# Patient Record
Sex: Male | Born: 1940 | Race: White | Hispanic: No | Marital: Married | Smoking: Former smoker
Health system: Southern US, Community
[De-identification: ages and names within clinical notes are randomized; demographics above are authoritative.]

## PROBLEM LIST (undated history)

## (undated) DIAGNOSIS — E785 Hyperlipidemia, unspecified: Secondary | ICD-10-CM

## (undated) DIAGNOSIS — C4490 Unspecified malignant neoplasm of skin, unspecified: Secondary | ICD-10-CM

## (undated) DIAGNOSIS — I10 Essential (primary) hypertension: Secondary | ICD-10-CM

## (undated) DIAGNOSIS — E119 Type 2 diabetes mellitus without complications: Secondary | ICD-10-CM

## (undated) HISTORY — DX: Unspecified malignant neoplasm of skin, unspecified: C44.90

## (undated) HISTORY — DX: Essential (primary) hypertension: I10

## (undated) HISTORY — DX: Type 2 diabetes mellitus without complications: E11.9

## (undated) HISTORY — DX: Hyperlipidemia, unspecified: E78.5

---

## 2000-05-28 HISTORY — PX: INGUINAL HERNIA REPAIR: SUR1180

## 2001-11-26 ENCOUNTER — Ambulatory Visit (HOSPITAL_COMMUNITY): Admission: RE | Admit: 2001-11-26 | Discharge: 2001-11-26 | Payer: Self-pay

## 2002-06-18 ENCOUNTER — Ambulatory Visit (HOSPITAL_BASED_OUTPATIENT_CLINIC_OR_DEPARTMENT_OTHER): Admission: RE | Admit: 2002-06-18 | Discharge: 2002-06-18 | Payer: Self-pay | Admitting: Family Medicine

## 2005-01-15 ENCOUNTER — Ambulatory Visit (HOSPITAL_COMMUNITY): Admission: RE | Admit: 2005-01-15 | Discharge: 2005-01-15 | Payer: Self-pay | Admitting: Family Medicine

## 2013-05-06 ENCOUNTER — Telehealth (INDEPENDENT_AMBULATORY_CARE_PROVIDER_SITE_OTHER): Payer: Self-pay

## 2013-05-06 NOTE — Telephone Encounter (Signed)
LMOM for pt to call me. I left a message at home but couldn't at work b/c it's been disconnected. Dr Dwain Sarna wanted me to ask if the pt wants to be seen earlier than his appt on 12/18. I asked for the pt to ask for me when he returns my call.

## 2013-05-06 NOTE — Telephone Encounter (Signed)
Pt returned my call. The pt declined moving his appt up earlier b/c he has something already planned for this Friday. I advised him that I would just leave it a lone for 05/14/13. The pt agrees.

## 2013-05-14 ENCOUNTER — Ambulatory Visit (INDEPENDENT_AMBULATORY_CARE_PROVIDER_SITE_OTHER): Payer: Medicare Other | Admitting: General Surgery

## 2013-05-14 ENCOUNTER — Encounter (INDEPENDENT_AMBULATORY_CARE_PROVIDER_SITE_OTHER): Payer: Self-pay | Admitting: General Surgery

## 2013-05-14 VITALS — BP 118/80 | HR 62 | Temp 97.1°F | Resp 14 | Ht 72.0 in | Wt 201.6 lb

## 2013-05-14 DIAGNOSIS — C4359 Malignant melanoma of other part of trunk: Secondary | ICD-10-CM

## 2013-05-14 NOTE — Progress Notes (Signed)
Patient ID: STAR CHEESE, male   DOB: 1940-12-31, 72 y.o.   MRN: [id number]  Chief Complaint  Patient presents with  . New Evaluation    melanoma rt chest    HPI David Yoder is a 72 y.o. male.  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 He is otherwise fairly healthy and active. He has stable hypertension and diabetes. He was evaluated by Dr. Doreen Beam recently was found to have a right chest wall nevus. He underwent a shave biopsy that shows a lentigo melanoma. The thickness is 0.62 mm. There is no ulceration identified and there is less than 1 per millimeters squared of the mitotic index. He comes in today to discuss treatment. Past Medical History  Diagnosis Date  . Diabetes mellitus without complication   . Hyperlipidemia   . Hypertension     Past Surgical History  Procedure Laterality Date  . Inguinal hernia repair  2002    Family History  Problem Relation Age of Onset  . Heart disease Father     Social History History  Substance Use Topics  . Smoking status: Former Games developer  . Smokeless tobacco: Not on file  . Alcohol Use: No    No Known Allergies  Current Outpatient Prescriptions  Medication Sig Dispense Refill  . atorvastatin (LIPITOR) 40 MG tablet Take 40 mg by mouth daily.      Marland Kitchen glimepiride (AMARYL) 4 MG tablet Take 4 mg by mouth daily with breakfast.      . lisinopril-hydrochlorothiazide (PRINZIDE,ZESTORETIC) 20-25 MG per tablet Take 1 tablet by mouth daily.       No current facility-administered medications for this visit.    Review of Systems Review of Systems  Constitutional: Negative for fever, chills and unexpected weight change.  HENT: Negative for congestion, hearing loss, sore throat, trouble swallowing and voice change.   Eyes: Negative for visual disturbance.  Respiratory: Negative for cough and wheezing.   Cardiovascular: Negative for chest pain, palpitations and leg  swelling.  Gastrointestinal: Negative for nausea, vomiting, abdominal pain, diarrhea, constipation, blood in stool, abdominal distention, anal bleeding and rectal pain.  Genitourinary: Negative for hematuria and difficulty urinating.  Musculoskeletal: Negative for arthralgias.  Skin: Negative for rash and wound.  Neurological: Negative for seizures, syncope, weakness and headaches.  Hematological: Negative for adenopathy. Does not bruise/bleed easily.  Psychiatric/Behavioral: Negative for confusion.    Blood pressure 118/80, pulse 62, temperature 97.1 F (36.2 C), resp. rate 14, height 6' (1.829 m), weight 201 lb 9.6 oz (91.445 kg).  Physical Exam Physical Exam  Vitals reviewed. Constitutional: He appears well-developed and well-nourished.  Neck: Neck supple.  Cardiovascular: Normal rate, regular rhythm and normal heart sounds.   Pulmonary/Chest: Effort normal and breath sounds normal. No respiratory distress.    Lymphadenopathy:    He has no cervical adenopathy.    He has no axillary adenopathy.       Right: No supraclavicular adenopathy present.       Left: No supraclavicular adenopathy present.    Data Reviewed Path and Dr Leta Speller note  Assessment    0.62 mm thick chest wall melanoma with no ulceration, <73mm mitoses    Plan    wle with 1 cm margin chest melanoma  I discussed with him a wide local excision with 1 cm margin for treatment of this thin melanoma with no ulceration and a low mitotic index. We discussed that I do not  think that we need to do any node evaluation for this. We discussed the surgery as an outpatient. We discussed is recovered I told him he could go back to work 2 days after surgery. We discussed risks associated with this as well. We'll plan on doing this soon.        Rachna Schonberger 05/14/2013, 9:54 AM

## 2013-05-15 ENCOUNTER — Other Ambulatory Visit (INDEPENDENT_AMBULATORY_CARE_PROVIDER_SITE_OTHER): Payer: Self-pay | Admitting: General Surgery

## 2013-05-15 ENCOUNTER — Other Ambulatory Visit (INDEPENDENT_AMBULATORY_CARE_PROVIDER_SITE_OTHER): Payer: Self-pay | Admitting: *Deleted

## 2013-05-15 DIAGNOSIS — C4359 Malignant melanoma of other part of trunk: Secondary | ICD-10-CM

## 2013-05-15 MED ORDER — OXYCODONE-ACETAMINOPHEN 5-325 MG PO TABS: 1.0000 | ORAL_TABLET | ORAL | Status: AC | PRN

## 2013-05-18 ENCOUNTER — Encounter (INDEPENDENT_AMBULATORY_CARE_PROVIDER_SITE_OTHER): Payer: Self-pay

## 2013-05-25 ENCOUNTER — Telehealth (INDEPENDENT_AMBULATORY_CARE_PROVIDER_SITE_OTHER): Payer: Self-pay | Admitting: General Surgery

## 2013-05-25 NOTE — Telephone Encounter (Signed)
Message copied by Ignacia Marvel on Mon May 25, 2013 11:16 AM ------      Message from: Mervin Kung      Created: Wed May 20, 2013 10:42 AM      Contact: [phone]       Elease Hashimoto,      Pt and family are aware Dr Renaldo Fiddler and nurse are not in office today but they would like results from the mass removed.       sonya ------

## 2013-05-25 NOTE — Telephone Encounter (Signed)
Dr. Dwain Sarna called the patient and informed him of his pathology.

## 2013-06-01 ENCOUNTER — Encounter (INDEPENDENT_AMBULATORY_CARE_PROVIDER_SITE_OTHER): Payer: Self-pay | Admitting: General Surgery

## 2013-06-01 ENCOUNTER — Ambulatory Visit (INDEPENDENT_AMBULATORY_CARE_PROVIDER_SITE_OTHER): Payer: Medicare Other | Admitting: General Surgery

## 2013-06-01 VITALS — BP 128/64 | HR 70 | Resp 16 | Ht 72.0 in | Wt 202.0 lb

## 2013-06-01 DIAGNOSIS — Z09 Encounter for follow-up examination after completed treatment for conditions other than malignant neoplasm: Secondary | ICD-10-CM

## 2013-06-01 NOTE — Progress Notes (Signed)
Subjective:     Patient ID: David Yoder, male   DOB: 08/22/1940, 73 y.o.   MRN: [id number]  HPI This is a 73 year old male who had a right chest melanoma. He went underwent wide local excision with 1 cm margins. He's done well after this a returns today without any complaints. Some of his Steri-Strips have begun  to come off. He comes back in to discuss his pathology which we have already discussed by phone.  Review of Systems     Objective:   Physical Exam Healing right chest incision without infection, no seroma    Assessment:     S/p wle melanoma, stage Ia    Plan:     No further treatment needed. He is released to full activity. He will continue to get his scheduled skin exams.

## 2013-07-01 ENCOUNTER — Encounter: Payer: Self-pay | Admitting: Internal Medicine

## 2013-08-03 ENCOUNTER — Ambulatory Visit (AMBULATORY_SURGERY_CENTER): Payer: Self-pay

## 2013-08-03 VITALS — Ht 70.0 in | Wt 206.8 lb

## 2013-08-03 DIAGNOSIS — Z860100 Personal history of colon polyps, unspecified: Secondary | ICD-10-CM

## 2013-08-03 MED ORDER — MOVIPREP 100 G PO SOLR: 1.0000 | Freq: Once | ORAL | Status: DC

## 2013-08-06 ENCOUNTER — Encounter: Payer: Self-pay | Admitting: Internal Medicine

## 2013-08-17 ENCOUNTER — Ambulatory Visit (AMBULATORY_SURGERY_CENTER): Payer: Medicare Other | Admitting: Internal Medicine

## 2013-08-17 ENCOUNTER — Encounter: Payer: Self-pay | Admitting: Internal Medicine

## 2013-08-17 ENCOUNTER — Telehealth: Payer: Self-pay

## 2013-08-17 ENCOUNTER — Other Ambulatory Visit: Payer: Self-pay

## 2013-08-17 VITALS — BP 94/70 | HR 51 | Temp 97.6°F | Resp 16 | Ht 71.0 in | Wt 206.0 lb

## 2013-08-17 DIAGNOSIS — R195 Other fecal abnormalities: Secondary | ICD-10-CM

## 2013-08-17 DIAGNOSIS — Z1211 Encounter for screening for malignant neoplasm of colon: Secondary | ICD-10-CM

## 2013-08-17 DIAGNOSIS — Z86010 Personal history of colonic polyps: Secondary | ICD-10-CM

## 2013-08-17 MED ORDER — SODIUM CHLORIDE 0.9 % IV SOLN: 500.0000 mL | INTRAVENOUS | Status: DC

## 2013-08-17 NOTE — Telephone Encounter (Signed)
Pt scheduled for Virtual Colon at [facility] [address] 08/24/13. Pt to arrive there at 7:45am for an 8am appt. Pt to pick up prep by Thursday from that office. Office number [phone]. Left message for pt to call back.

## 2013-08-17 NOTE — Progress Notes (Signed)
Procedure ends, to recovery, report given and VSS. 

## 2013-08-17 NOTE — Patient Instructions (Signed)
YOU HAD AN ENDOSCOPIC PROCEDURE TODAY AT THE [facility]: Refer to the procedure report that was given to you for any specific questions about what was found during the examination.  If the procedure report does not answer your questions, please call your gastroenterologist to clarify.  If you requested that your care partner not be given the details of your procedure findings, then the procedure report has been included in a sealed envelope for you to review at your convenience later.  YOU SHOULD EXPECT: Some feelings of bloating in the abdomen. Passage of more gas than usual.  Walking can help get rid of the air that was put into your GI tract during the procedure and reduce the bloating. If you had a lower endoscopy (such as a colonoscopy or flexible sigmoidoscopy) you may notice spotting of blood in your stool or on the toilet paper. If you underwent a bowel prep for your procedure, then you may not have a normal bowel movement for a few days.  DIET: Your first meal following the procedure should be a light meal and then it is ok to progress to your normal diet.  A half-sandwich or bowl of soup is an example of a good first meal.  Heavy or fried foods are harder to digest and may make you feel nauseous or bloated.  Likewise meals heavy in dairy and vegetables can cause extra gas to form and this can also increase the bloating.  Drink plenty of fluids but you should avoid alcoholic beverages for 24 hours.  ACTIVITY: Your care partner should take you home directly after the procedure.  You should plan to take it easy, moving slowly for the rest of the day.  You can resume normal activity the day after the procedure however you should NOT DRIVE or use heavy machinery for 24 hours (because of the sedation medicines used during the test).    SYMPTOMS TO REPORT IMMEDIATELY: A gastroenterologist can be reached at any hour.  During normal business hours, 8:30 AM to 5:00 PM Monday through Friday,  call [phone].  After hours and on weekends, please call the GI answering service at [phone] who will take a message and have the physician on call contact you.   Following lower endoscopy (colonoscopy or flexible sigmoidoscopy):  Excessive amounts of blood in the stool  Significant tenderness or worsening of abdominal pains  Swelling of the abdomen that is new, acute  Fever of 100F or higher    FOLLOW UP: If any biopsies were taken you will be contacted by phone or by letter within the next 1-3 weeks.  Call your gastroenterologist if you have not heard about the biopsies in 3 weeks.  Our staff will call the home number listed on your records the next business day following your procedure to check on you and address any questions or concerns that you may have at that time regarding the information given to you following your procedure. This is a courtesy call and so if there is no answer at the home number and we have not heard from you through the emergency physician on call, we will assume that you have returned to your regular daily activities without incident.  SIGNATURES/CONFIDENTIALITY: You and/or your care partner have signed paperwork which will be entered into your electronic medical record.  These signatures attest to the fact that that the information above on your After Visit Summary has been reviewed and is understood.  Full responsibility of the confidentiality   of this discharge information lies with you and/or your care-partner.  Dr. Blanch Media office will be in touch to shedule a virtual colonoscopy.

## 2013-08-17 NOTE — Op Note (Signed)
[facility]  [address] [place], [address]   COLONOSCOPY PROCEDURE REPORT  PATIENT: David Yoder, David Yoder  MR#: [id number] BIRTHDATE: 03-30-1941 , 72  yrs. old GENDER: Male ENDOSCOPIST: Eustace Quail, MD REFERRED GB:[id number] Arelia Sneddon, M.D. PROCEDURE DATE:  08/17/2013 PROCEDURE:   Colonoscopy, diagnostic (incomplete) First Screening Colonoscopy - Avg.  risk and is 50 yrs.  old or older - No.  Prior Negative Screening - Now for repeat screening. N/A  History of Adenoma - Now for follow-up colonoscopy & has been > or = to 3 yrs.  N/A  Polyps Removed Today? No.  Recommend repeat exam, <10 yrs? No. ASA CLASS:   Class II INDICATIONS:heme-positive stool. MEDICATIONS: MAC sedation, administered by CRNA and propofol (Diprivan) 250mg  IV  DESCRIPTION OF PROCEDURE:   After the risks benefits and alternatives of the procedure were thoroughly explained, informed consent was obtained.  A digital rectal exam revealed no abnormalities of the rectum.   The LB [id number] and LB [id number]  endoscope was introduced through the anus and advanced to the sigmoid colon. No adverse events experienced. Limited by a stricture.   The quality of the prep was good, using MoviPrep  The instrument was then slowly withdrawn as the colon was fully examined.      COLON FINDINGS: Severe diverticulosis with tight stenosis of the sigmoid colon, not permitting passage of the pediatric colonoscope proximal. The remaining portions of the distal colon were normal. Retroflexed views revealed internal hemorrhoids. The time to cecum=minutes 0 seconds.  Withdrawal time=minutes 0 seconds.  The scope was withdrawn and the procedure completed. COMPLICATIONS: There were no complications.  ENDOSCOPIC IMPRESSION: 1.Severe diverticulosis with tight sigmoid stenosis 2. Incomplete exam  RECOMMENDATIONS: 1. My office will arrange for a virtual colonoscopy "heme positive stool in the sigmoid  obstruction, evaluate"   eSigned:  Eustace Quail, MD 08/17/2013 11:26 AM   cc: Claris Gower, MD and The Patient

## 2013-08-18 ENCOUNTER — Telehealth: Payer: Self-pay | Admitting: *Deleted

## 2013-08-18 NOTE — Telephone Encounter (Signed)
Pt aware of appt date and time

## 2013-08-18 NOTE — Telephone Encounter (Signed)
  Follow up Call-  Call back number 08/17/2013  Post procedure Call Back phone  # [phone]  Permission to leave phone message Yes     Patient questions:  Do you have a fever, pain , or abdominal swelling? no Pain Score  0 *  Have you tolerated food without any problems? yes  Have you been able to return to your normal activities? yes  Do you have any questions about your discharge instructions: Diet   no Medications  no Follow up visit  no  Do you have questions or concerns about your Care? no  Actions: * If pain score is 4 or above: No action needed, pain <4.

## 2013-08-24 ENCOUNTER — Ambulatory Visit
Admission: RE | Admit: 2013-08-24 | Discharge: 2013-08-24 | Disposition: A | Discharge status: Home / Self Care | Payer: Medicare Other | Source: Ambulatory Visit | Attending: Internal Medicine | Admitting: Internal Medicine

## 2013-08-24 DIAGNOSIS — R195 Other fecal abnormalities: Secondary | ICD-10-CM

## 2014-10-31 IMAGING — CT CT VIRTUAL COLONOSCOPY DIAGNOSTIC
3 of 7 series · 12 of 36 positions shown, 18 images · non-contrast
Comparison: None.

CLINICAL DATA: Incomplete colonoscopy due to sigmoid obstruction,
heme-positive stool

EXAM:
CT VIRTUAL COLONOSCOPY DIAGNOSTIC
TECHNIQUE: The patient was given a standard mag citrate bowel preparation with
Gastrografin and barium for fluid and stool tagging respectively.
The quality of the bowel preparation is moderate. Automated CO2
insufflation of the colon was performed prior to image acquisition
and colonic distention is moderate. Image post processing was used
to generate a 3D endoluminal fly-through projection of the colon and
to electronically subtract stool/fluid as appropriate.

[Series 2: supine (id) · axial · 0.77mm/px · z∈[-428,-23]mm · 7 of 434 slices shown, 12 images]
[im 55/434  soft-tissue]
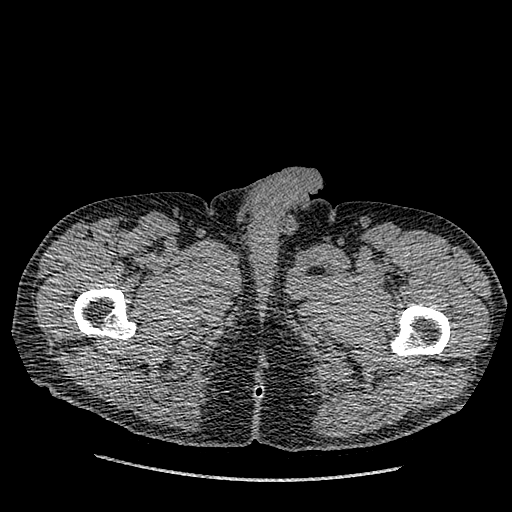
[im 55/434  bone]
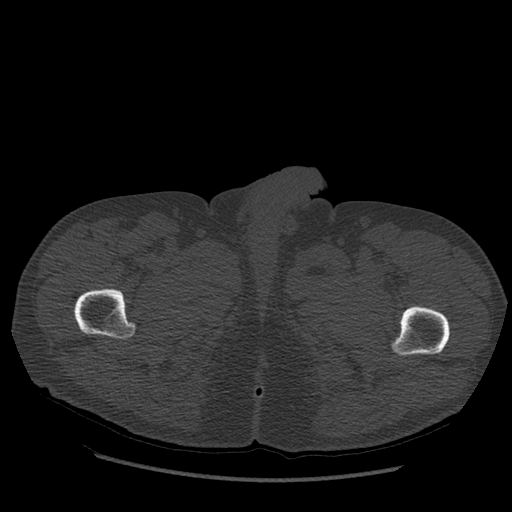
[im 109/434  soft-tissue]
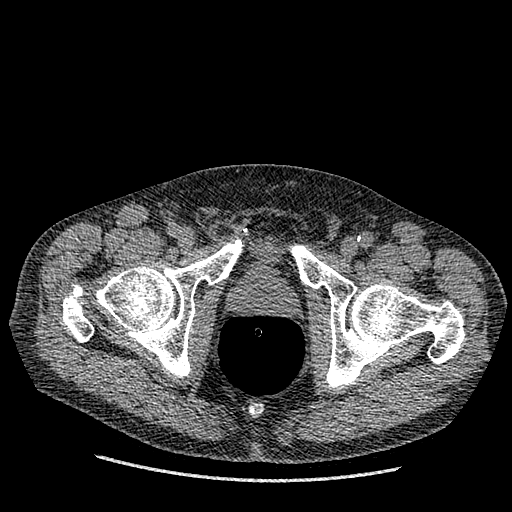
[im 163/434  soft-tissue]
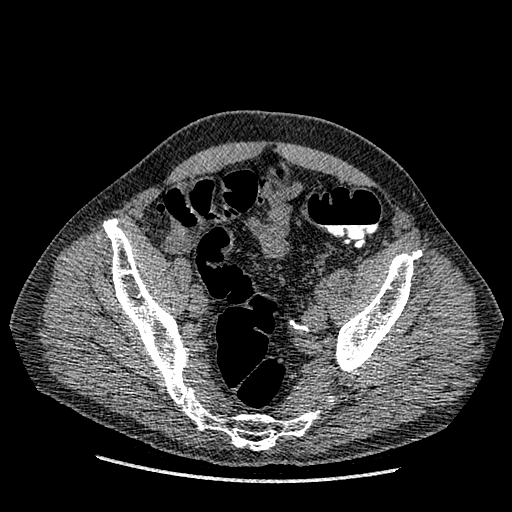
[im 217/434  soft-tissue]
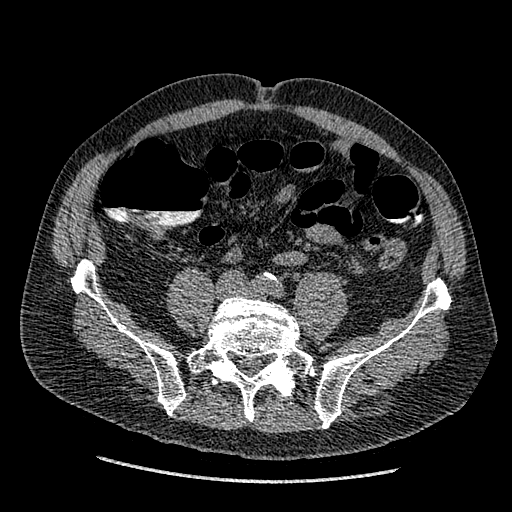
[im 217/434  lung]
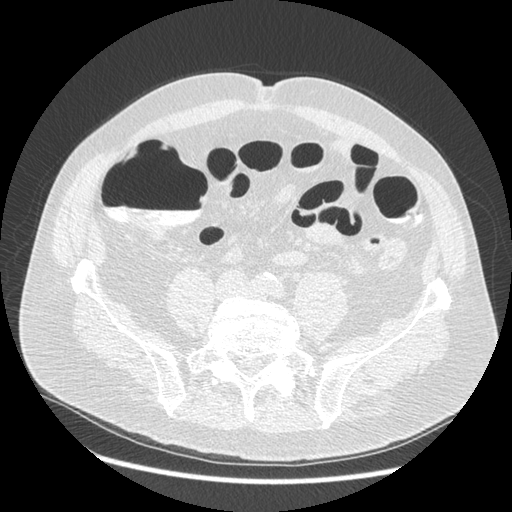
[im 271/434  soft-tissue]
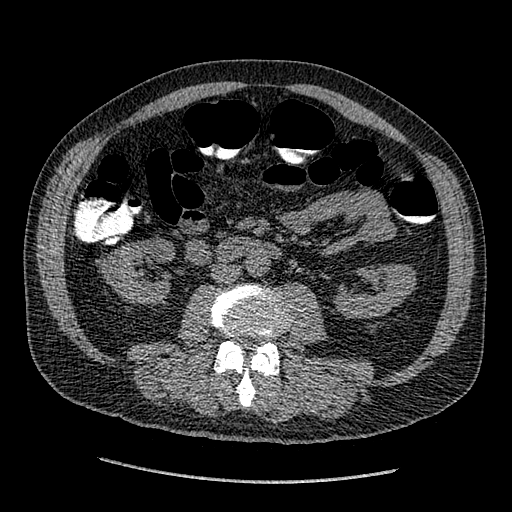
[im 271/434  lung]
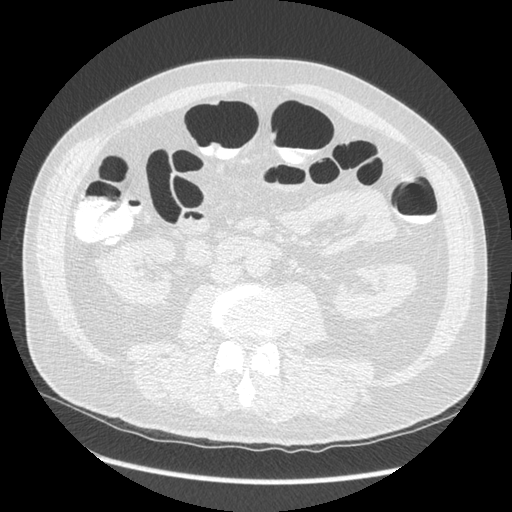
[im 325/434  soft-tissue]
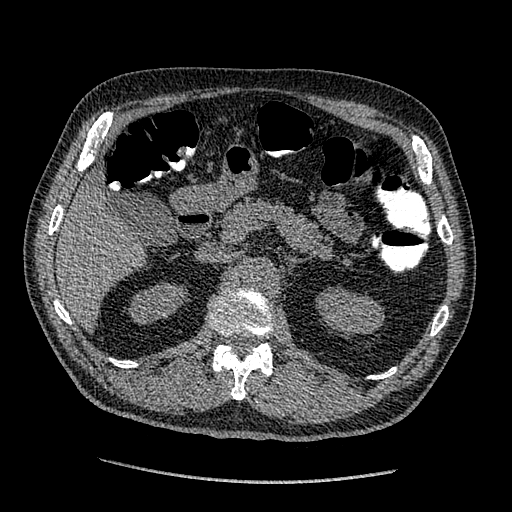
[im 325/434  lung]
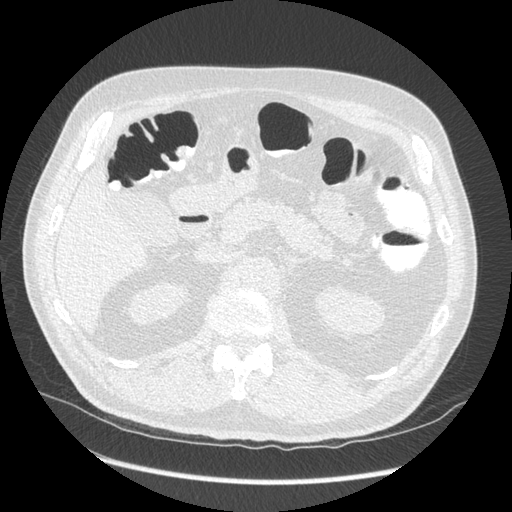
[im 379/434  soft-tissue]
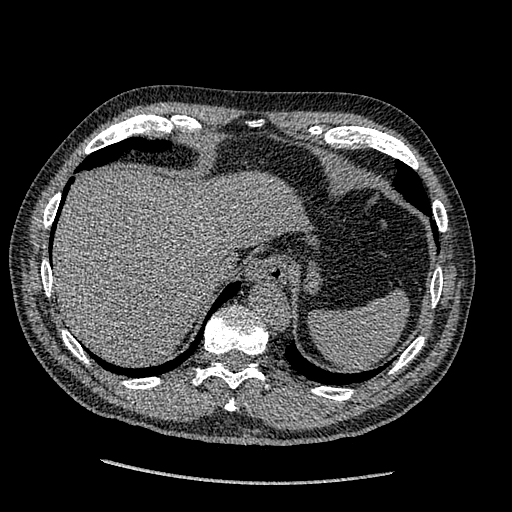
[im 379/434  lung]
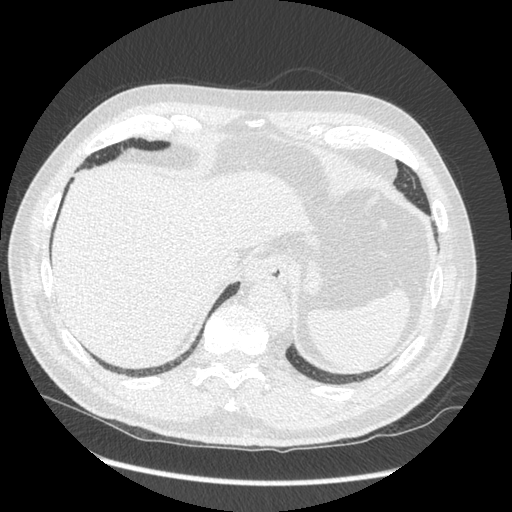

[Series 6: prone (id) · axial · 0.76mm/px · z∈[-477,-271]mm · 4 of 441 slices shown]
[im 56/441  soft-tissue]
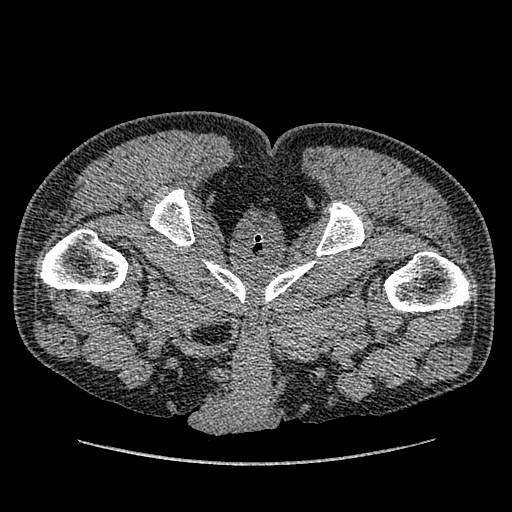
[im 111/441  soft-tissue]
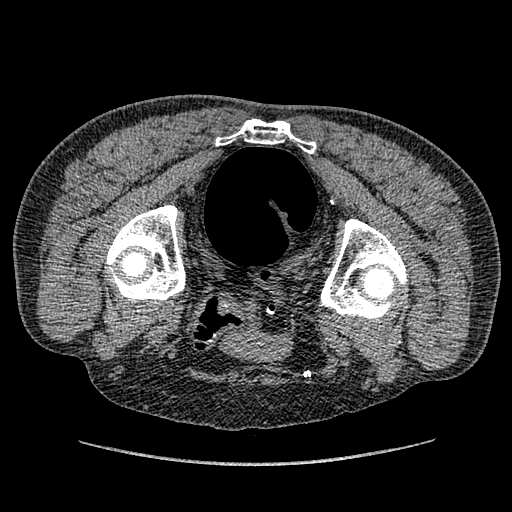
[im 166/441  soft-tissue]
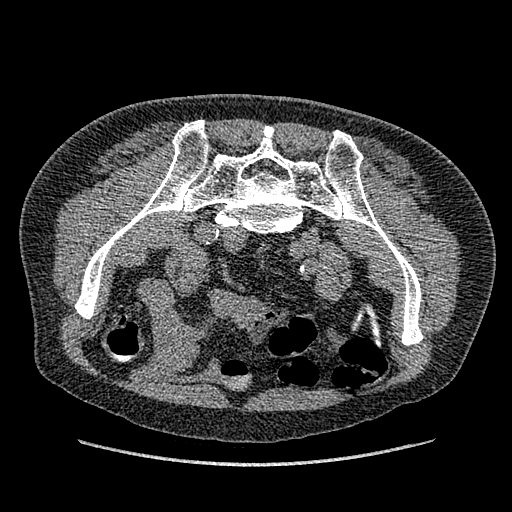
[im 221/441  soft-tissue]
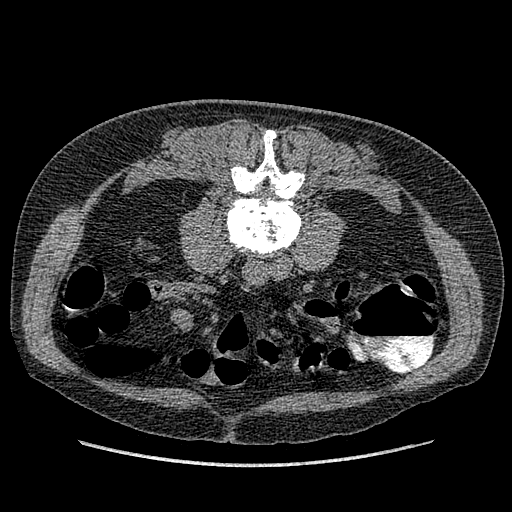

[Series 601: coronal body · coronal · 1.06mm/px · 1 of 129 slices shown, 2 images]
[im 43/129  soft-tissue]
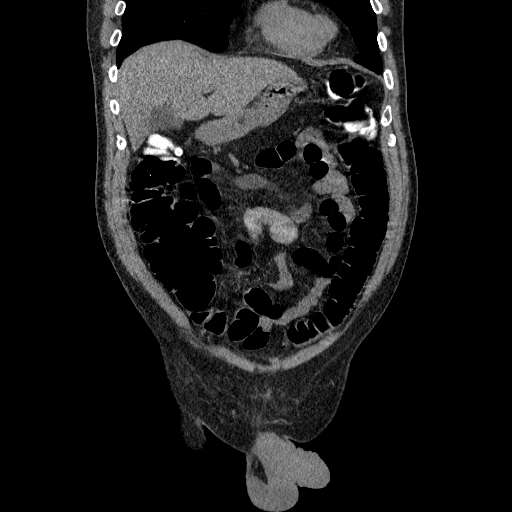
[im 43/129  bone]
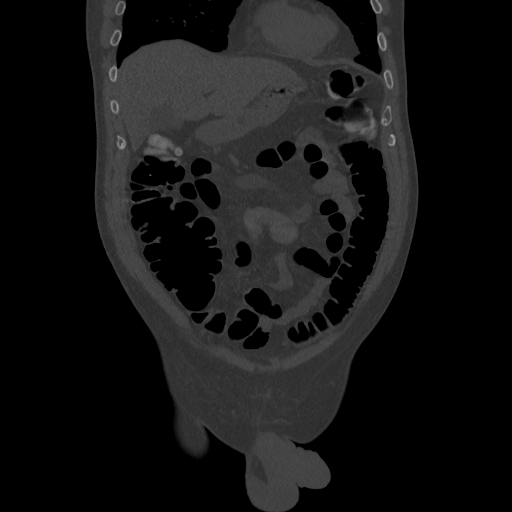

[12 of 36 positions shown; findings below may reference images not displayed]

FINDINGS: VIRTUAL COLONOSCOPY

Extensive colonic diverticulosis, predominately involving the
sigmoid colon, with associated short-segment luminal narrowing/wall
thickening in the proximal/mid sigmoid colon (series 3/image 147).
This corresponds to the known stricture on colonoscopy. Contrast
remains within the adjacent tics, and there is a small amount of
intraluminal contrast in this region, which favors the sequela of
chronic diverticulosis rather than an underlying mass. Neoplasm is
difficult to exclude on CT but is considered less likely.

No acute inflammatory changes to suggest acute diverticulosis.

In the remainder of the colon, there are no significant colonic
polypoid lesions or masses.

No evidence of bowel obstruction.  Normal appendix.

Virtual colonoscopy is not designed to detect diminutive polyps
(i.e., less than or equal to 5 mm), the presence or absence of which
may not affect clinical management.

CT ABDOMEN AND PELVIS WITHOUT CONTRAST

Lung bases are clear.

Small sliding hiatal hernia in the prone position.

Unenhanced liver, spleen, pancreas, and adrenal glands are within
normal limits.

Gallbladder is unremarkable. No intrahepatic or extrahepatic ductal
dilatation.

Kidneys are unremarkable.  No renal calculi or hydronephrosis.

Atherosclerotic calcifications of the abdominal aorta and branch
vessels. Circumaortic left renal vein.

No abdominopelvic ascites.

No suspicious abdominopelvic lymphadenopathy.

Prostate is unremarkable.

Bladder is underdistended.

Postsurgical changes related to prior right inguinal hernia repair.
Tiny fat containing left inguinal hernia.

Mild degenerative changes of the visualized thoracolumbar spine,
most prominent at L3-4.
IMPRESSION: Persistent wall thickening/stricture involving the sigmoid colon,
corresponding to the endoscopic finding, favored to reflect sequela
of chronic diverticulosis. An underlying mass is difficult to
exclude by CT but is considered less likely.

Otherwise, no significant colonic polypoid lesion, mass, or
stricture.

Unremarkable unenhanced CT abdomen/pelvis.

## 2015-12-29 ENCOUNTER — Ambulatory Visit (HOSPITAL_BASED_OUTPATIENT_CLINIC_OR_DEPARTMENT_OTHER): Payer: Medicare Other | Attending: Family Medicine | Admitting: Internal Medicine

## 2015-12-29 VITALS — Ht 72.0 in | Wt 210.0 lb

## 2015-12-29 DIAGNOSIS — G4733 Obstructive sleep apnea (adult) (pediatric): Secondary | ICD-10-CM | POA: Insufficient documentation

## 2015-12-29 DIAGNOSIS — I493 Ventricular premature depolarization: Secondary | ICD-10-CM | POA: Insufficient documentation

## 2015-12-29 DIAGNOSIS — R0683 Snoring: Secondary | ICD-10-CM | POA: Diagnosis not present

## 2016-01-21 DIAGNOSIS — R0683 Snoring: Secondary | ICD-10-CM | POA: Diagnosis not present

## 2016-01-21 NOTE — Procedures (Signed)
Patient Name: David Yoder, Kops Date: 12/29/2015 Gender: Male D.O.B: 1941-02-09 Age (years): 74 Referring Provider: Claris Gower Height (inches): 42 Interpreting Physician: Baird Lyons MD, ABSM Weight (lbs): 210 RPSGT: Baxter Flattery BMI: 30 MRN: [id number] Neck Size: 17.00 CLINICAL INFORMATION Sleep Study Type: Split Night CPAP Indication for sleep study: OSA, Snoring, Witnessed Apneas Epworth Sleepiness Score: 11  SLEEP STUDY TECHNIQUE As per the AASM Manual for the Scoring of Sleep and Associated Events v2.3 (April 2016) with a hypopnea requiring 4% desaturations. The channels recorded and monitored were frontal, central and occipital EEG, electrooculogram (EOG), submentalis EMG (chin), nasal and oral airflow, thoracic and abdominal wall motion, anterior tibialis EMG, snore microphone, electrocardiogram, and pulse oximetry. Continuous positive airway pressure (CPAP) was initiated when the patient met split night criteria and was titrated according to treat sleep-disordered breathing.  MEDICATIONS Medications taken by the patient : charted for review Medications administered by patient during sleep study : No sleep medicine administered.  RESPIRATORY PARAMETERS Diagnostic Total AHI (/hr): 18.6 RDI (/hr): 18.6 OA Index (/hr): 18.1 CA Index (/hr): 0.5 REM AHI (/hr): 0.0 NREM AHI (/hr): 20.1 Supine AHI (/hr): 36.3 Non-supine AHI (/hr): 0.93 Min O2 Sat (%): 89.00 Mean O2 (%): 93.30 Time below 88% (min): 0.0   Titration Optimal Pressure (cm): 12 AHI at Optimal Pressure (/hr): 0.0 Min O2 at Optimal Pressure (%): 92.0 Supine % at Optimal (%): 0 Sleep % at Optimal (%): 97  SLEEP ARCHITECTURE The recording time for the entire night was 395.7 minutes. During a baseline period of 198.5 minutes, the patient slept for 129.0 minutes in REM and nonREM, yielding a sleep efficiency of 65.0%. Sleep onset after lights out was 4.1 minutes with a REM latency of 70.0 minutes. The patient  spent 28.29% of the night in stage N1 sleep, 64.34% in stage N2 sleep, 0.00% in stage N3 and 7.36% in REM. During the titration period of 193.7 minutes, the patient slept for 156.6 minutes in REM and nonREM, yielding a sleep efficiency of 80.9%. Sleep onset after CPAP initiation was 24.6 minutes with a REM latency of 42.5 minutes. The patient spent 17.56% of the night in stage N1 sleep, 50.52% in stage N2 sleep, 0.00% in stage N3 and 31.92% in REM.  CARDIAC DATA The 2 lead EKG demonstrated sinus rhythm. The mean heart rate was 51.17 beats per minute. Other EKG findings include: PVCs.  LEG MOVEMENT DATA The total Periodic Limb Movements of Sleep (PLMS) were 0. The PLMS index was 0.00 .  IMPRESSIONS - Moderate obstructive sleep apnea occurred during the diagnostic portion of the study(AHI = 18.6/hour). An optimal PAP pressure was selected for this patient ( 12 cm of water) - No significant central sleep apnea occurred during the diagnostic portion of the study (CAI = 0.5/hour). - Mild oxygen desaturation was noted during the diagnostic portion of the study (Min O2 = 89.00%). - No snoring was audible during the diagnostic portion of the study. - No cardiac abnormalities were noted during this study. - Clinically significant periodic limb movements did not occur during sleep.  DIAGNOSIS - Obstructive Sleep Apnea (327.23 [G47.33 ICD-10])  RECOMMENDATIONS - Trial of CPAP therapy on 12 cm H2O with a Large size Resmed Full Face Mask AirFit F20 mask and heated humidification. - Avoid alcohol, sedatives and other CNS depressants that may worsen sleep apnea and disrupt normal sleep architecture. - Sleep hygiene should be reviewed to assess factors that may improve sleep quality. - Weight management and regular exercise should be  initiated or continued.  [Electronically signed] 01/21/2016 10:19 AM  Baird Lyons MD, ABSM Diplomate, American Board of Sleep Medicine   NPI: [id number] [place], American Board of Sleep Medicine  ELECTRONICALLY SIGNED ON:  01/21/2016, 10:16 AM [place] PH: [phone]   FX: [phone] [place]

## 2018-07-23 ENCOUNTER — Ambulatory Visit
Admission: RE | Admit: 2018-07-23 | Discharge: 2018-07-23 | Disposition: A | Discharge status: Home / Self Care | Payer: Medicare Other | Source: Ambulatory Visit | Attending: Family Medicine | Admitting: Family Medicine

## 2018-07-23 ENCOUNTER — Other Ambulatory Visit: Payer: Self-pay | Admitting: Family Medicine

## 2018-07-23 DIAGNOSIS — R059 Cough, unspecified: Secondary | ICD-10-CM

## 2018-07-23 DIAGNOSIS — R05 Cough: Secondary | ICD-10-CM

## 2019-02-24 ENCOUNTER — Encounter: Payer: Self-pay | Admitting: Internal Medicine

## 2019-03-24 ENCOUNTER — Encounter (INDEPENDENT_AMBULATORY_CARE_PROVIDER_SITE_OTHER): Payer: Self-pay

## 2019-03-31 ENCOUNTER — Other Ambulatory Visit: Payer: Self-pay

## 2019-03-31 ENCOUNTER — Encounter: Payer: Self-pay | Admitting: Internal Medicine

## 2019-03-31 ENCOUNTER — Ambulatory Visit (INDEPENDENT_AMBULATORY_CARE_PROVIDER_SITE_OTHER): Payer: Medicare Other | Admitting: Internal Medicine

## 2019-03-31 VITALS — BP 124/88 | HR 68 | Temp 98.9°F | Ht 71.0 in | Wt 223.0 lb

## 2019-03-31 DIAGNOSIS — K56699 Other intestinal obstruction unspecified as to partial versus complete obstruction: Secondary | ICD-10-CM

## 2019-03-31 DIAGNOSIS — Z1211 Encounter for screening for malignant neoplasm of colon: Secondary | ICD-10-CM | POA: Diagnosis not present

## 2019-03-31 DIAGNOSIS — K5790 Diverticulosis of intestine, part unspecified, without perforation or abscess without bleeding: Secondary | ICD-10-CM | POA: Diagnosis not present

## 2019-03-31 NOTE — Progress Notes (Signed)
HISTORY OF PRESENT ILLNESS:  David Yoder is a 78 y.o. male who sent today by his primary care provider questioning the need for virtual colonoscopy as a mode for screening colonoscopy.  Patient underwent screening colonoscopy in 2003.  He was found to have severe sigmoid stenosis with erythematous mucosa.  This was biopsied and returned negative.  A barium enema was obtained to rule out possible lesion missed on colonoscopy due to the altered anatomy.  He subsequently underwent colonoscopy August 17, 2013 to evaluate Hemoccult-positive stool.  He was noted to have severe sigmoid diverticulosis with sigmoid stenosis.  A virtual colonoscopy was performed August 24, 2013.  This revealed persistent wall thickening and stricture involving the sigmoid colon felt to represent sequelae of chronic diverticulosis.  No obvious mass noted.  Patient tells me that he his primary care provider encouraged him to follow-up in this office regarding the need for virtual colonoscopy.  The patient did not remember that he had completed such an exam.  His GI review of systems is entirely negative.  Good appetite.  No weight loss.  No change in bowel habits.  No bleeding.  He denies to me that his primary care provider raise concerns with regards to laboratories or stool studies.  No family history of colon cancer.  At the time of his last examination no routine follow-up was recommended or planned.  REVIEW OF SYSTEMS:  All non-GI ROS negative unless otherwise stated in the HPI.  Past Medical History:  Diagnosis Date  . Diabetes mellitus without complication ([place])   . Hyperlipidemia   . Hypertension   . Skin cancer     Past Surgical History:  Procedure Laterality Date  . INGUINAL HERNIA REPAIR Right 2002  . SKIN CANCER EXCISION     face    Social History David Yoder  reports that he quit smoking about 30 years ago. He quit smokeless tobacco use about 30 years ago.  His smokeless tobacco use included chew. He  reports that he does not drink alcohol or use drugs.  family history includes Heart disease in his father.  No Known Allergies     PHYSICAL EXAMINATION: Vital signs: BP 124/88 (BP Location: Left Arm, Patient Position: Sitting, Cuff Size: Normal)   Pulse 68   Temp 98.9 F (37.2 C)   Ht 5\' 11"  (1.803 m) Comment: height measured without shoes  Wt 223 lb (101.2 kg)   BMI 31.10 kg/m   Constitutional: generally well-appearing, no acute distress Psychiatric: alert and oriented x3, cooperative Eyes: extraocular movements intact, anicteric, conjunctiva pink Mouth: oral pharynx moist, no lesions Neck: supple no lymphadenopathy Cardiovascular: heart regular rate and rhythm, no murmur Lungs: clear to auscultation bilaterally Abdomen: soft, nontender, nondistended, no obvious ascites, no peritoneal signs, normal bowel sounds, no organomegaly Rectal: Omitted Extremities: no clubbing, cyanosis, or lower extremity edema bilaterally Skin: no lesions on visible extremities Neuro: No focal deficits.  Cranial nerve intact  ASSESSMENT:  1.  History of diverticulosis complicated by sigmoid stenosis.  Colonoscopy in 2003 and incomplete colonoscopy 2015 followed by virtual colonoscopy as described.  Patient is 24, asymptomatic, and without family history.  He did not realize that he had completed previous virtual colonoscopy.  No plans for routine screening strategy at this time given previous findings and current age   PLAN:  1.  High-fiber diet 2.  Return to the care of your primary provider 3.  GI follow-up as needed

## 2019-03-31 NOTE — Patient Instructions (Signed)
Please follow up as needed 

## 2019-09-29 IMAGING — CR DG CHEST 2V
2 series · 2 of 2 positions shown · non-contrast
Comparison: None.

CLINICAL DATA: Cough.

EXAM:
CHEST - 2 VIEW

[w chest pa]
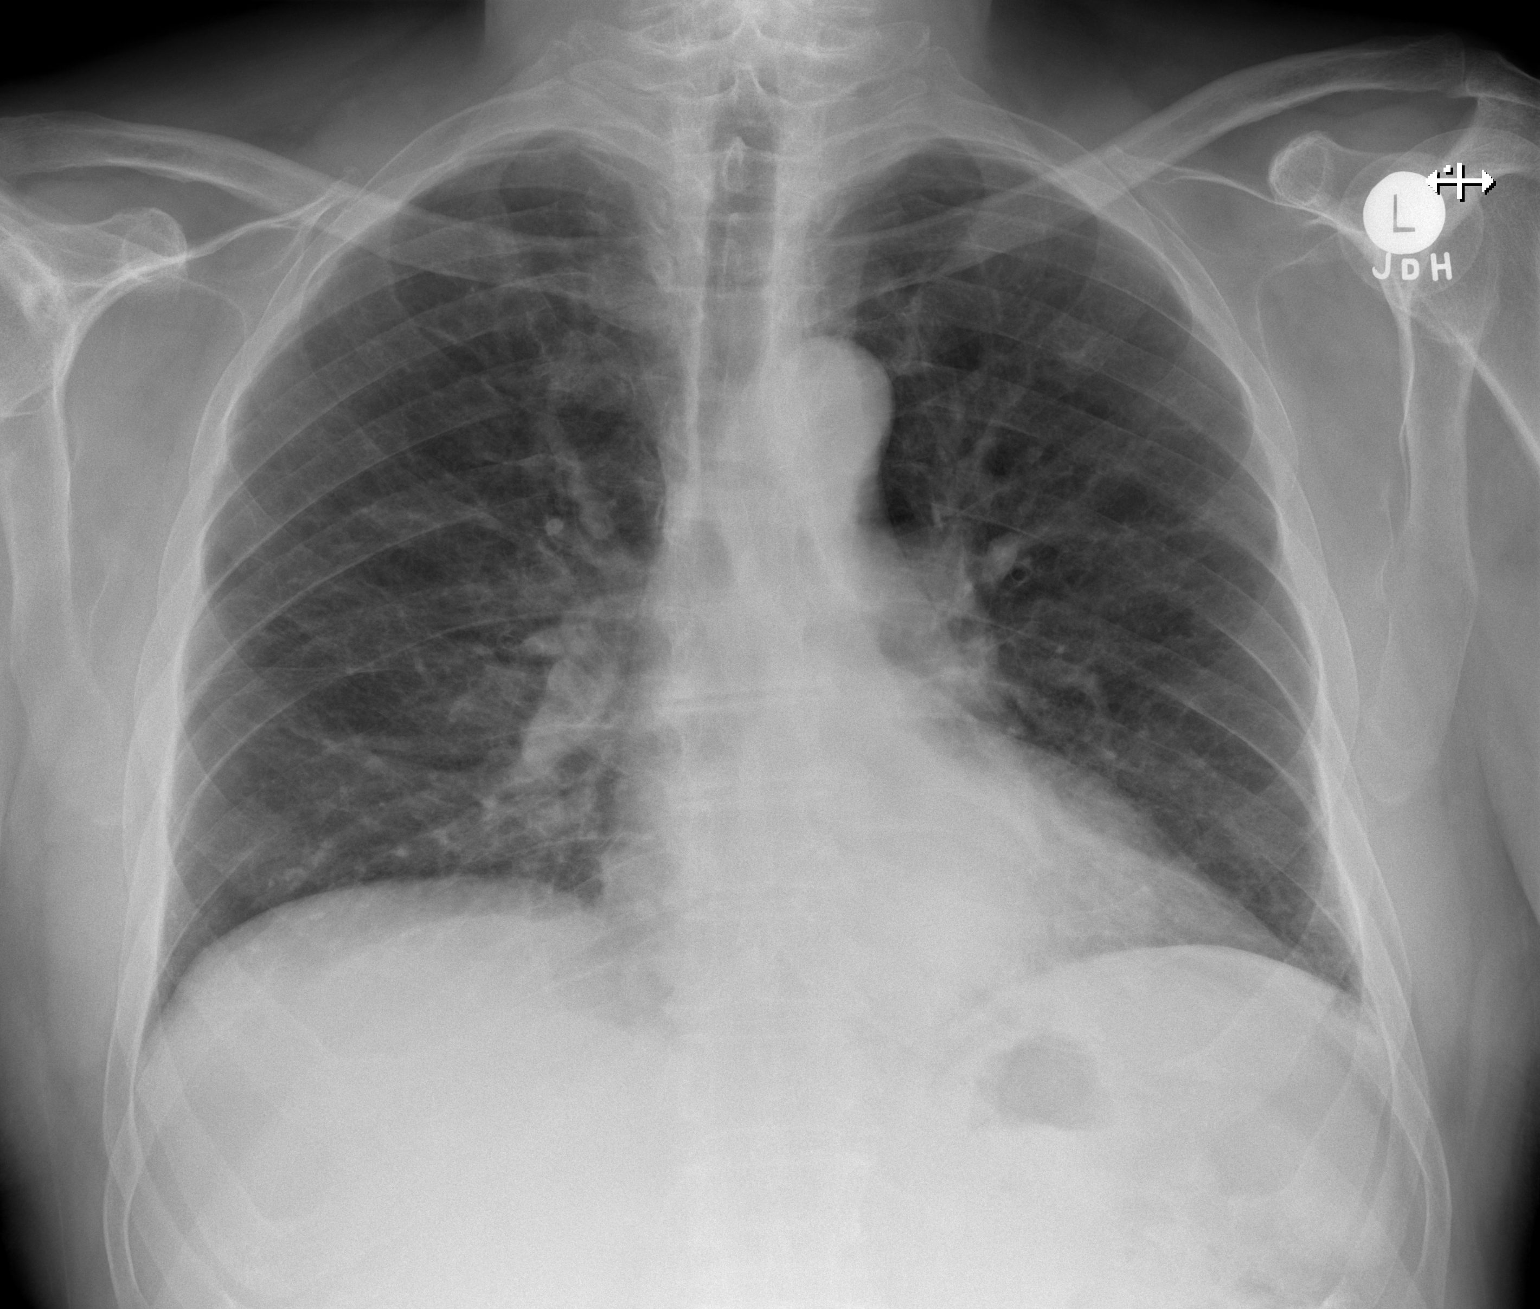

[w chest lat]
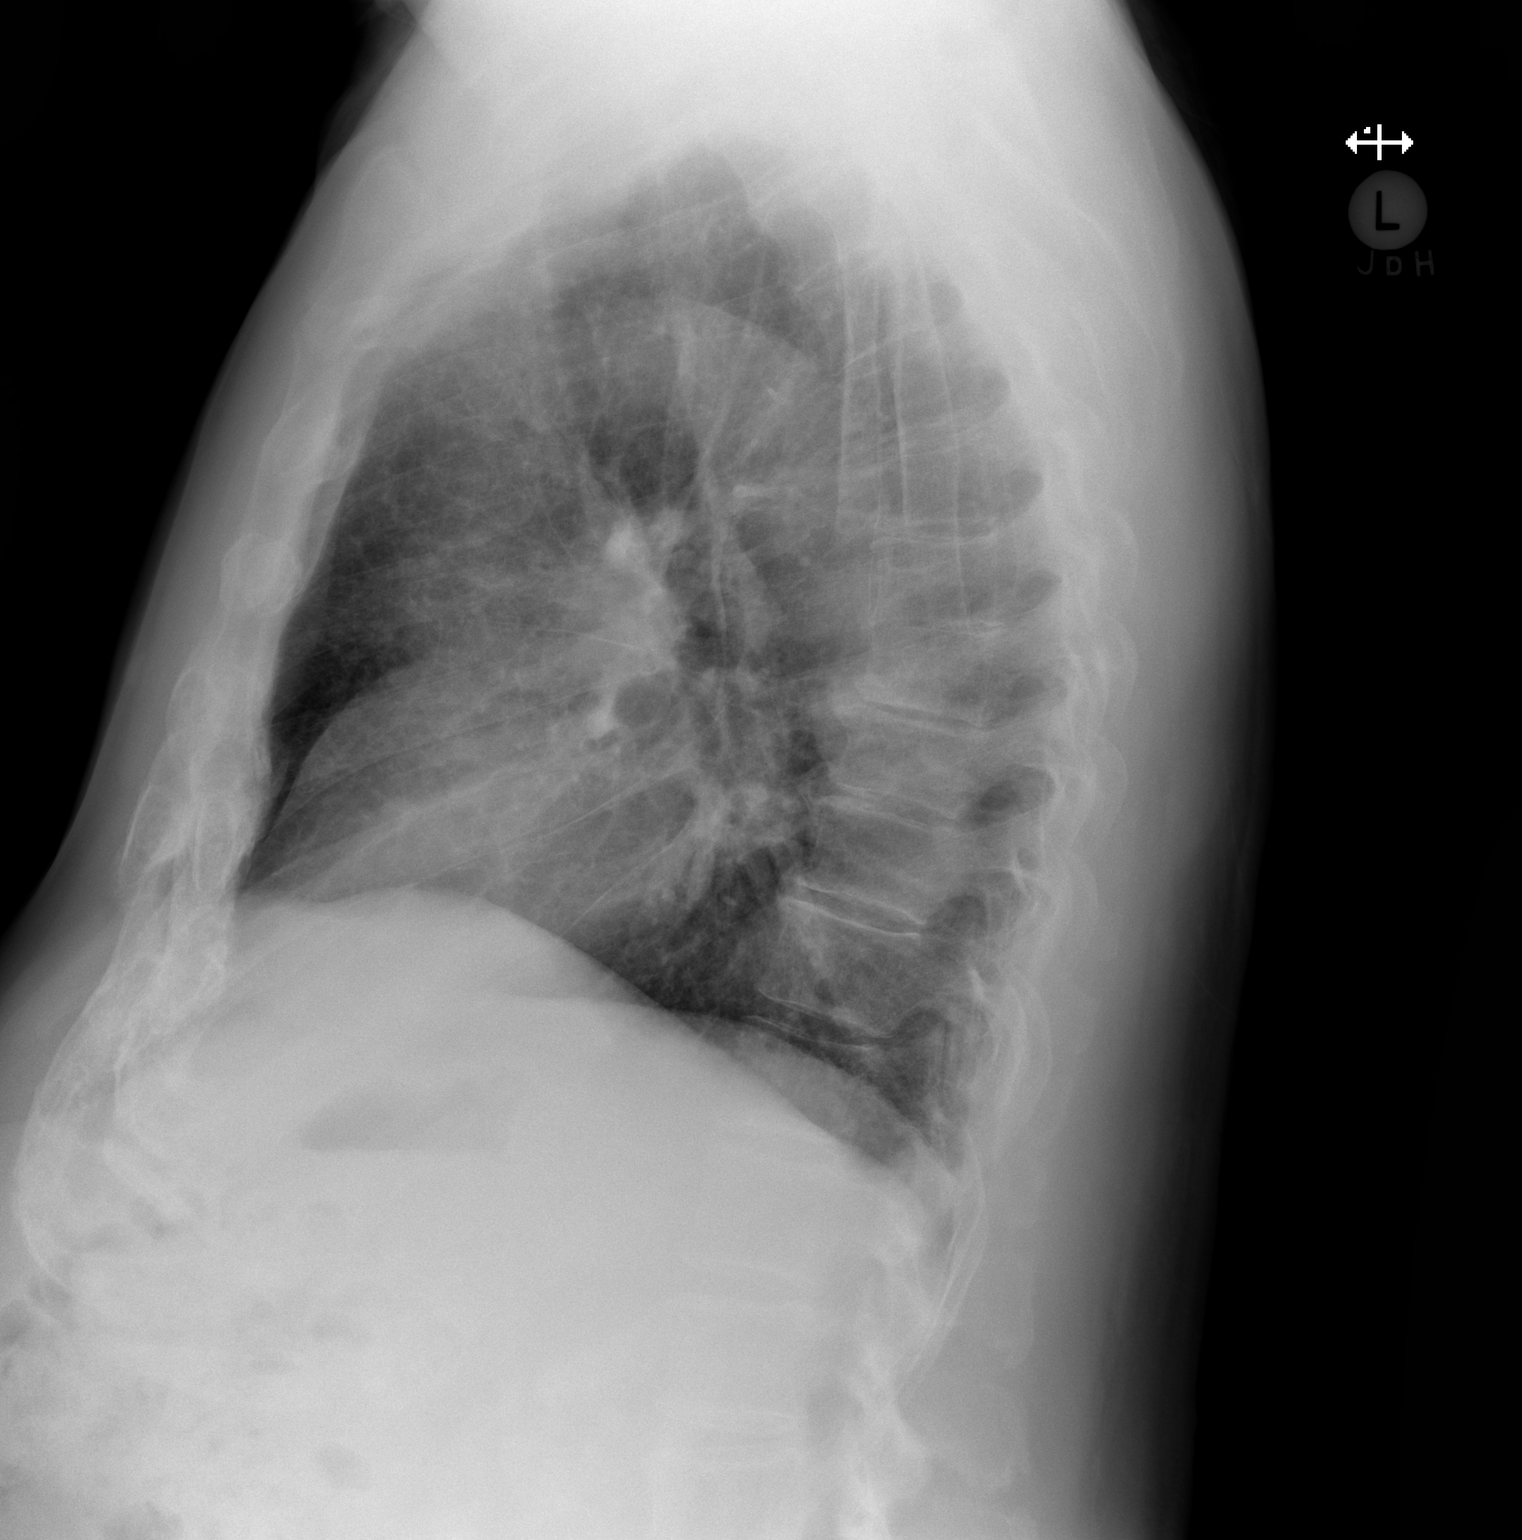

[2 of 2 positions shown; findings below may reference images not displayed]

FINDINGS: The heart size and mediastinal contours are within normal limits.
Both lungs are clear. The visualized skeletal structures are
unremarkable.
IMPRESSION: No active cardiopulmonary disease.
